# Patient Record
Sex: Female | Born: 1955 | Race: White | Hispanic: No | Marital: Married | Smoking: Never smoker
Health system: Southern US, Community
[De-identification: ages and names within clinical notes are randomized; demographics above are authoritative.]

## PROBLEM LIST (undated history)

## (undated) DIAGNOSIS — K219 Gastro-esophageal reflux disease without esophagitis: Secondary | ICD-10-CM

## (undated) DIAGNOSIS — I8390 Asymptomatic varicose veins of unspecified lower extremity: Secondary | ICD-10-CM

## (undated) DIAGNOSIS — G4730 Sleep apnea, unspecified: Secondary | ICD-10-CM

## (undated) DIAGNOSIS — L409 Psoriasis, unspecified: Secondary | ICD-10-CM

## (undated) DIAGNOSIS — M1990 Unspecified osteoarthritis, unspecified site: Secondary | ICD-10-CM

## (undated) DIAGNOSIS — E079 Disorder of thyroid, unspecified: Secondary | ICD-10-CM

## (undated) DIAGNOSIS — L719 Rosacea, unspecified: Secondary | ICD-10-CM

## (undated) DIAGNOSIS — E7800 Pure hypercholesterolemia, unspecified: Secondary | ICD-10-CM

## (undated) DIAGNOSIS — Z8739 Personal history of other diseases of the musculoskeletal system and connective tissue: Secondary | ICD-10-CM

## (undated) DIAGNOSIS — R339 Retention of urine, unspecified: Secondary | ICD-10-CM

## (undated) DIAGNOSIS — K146 Glossodynia: Secondary | ICD-10-CM

## (undated) DIAGNOSIS — M797 Fibromyalgia: Secondary | ICD-10-CM

## (undated) DIAGNOSIS — B029 Zoster without complications: Secondary | ICD-10-CM

## (undated) HISTORY — PX: TOTAL HIP ARTHROPLASTY: SHX124

## (undated) HISTORY — PX: ABDOMINAL HYSTERECTOMY: SHX81

## (undated) HISTORY — PX: TONSILLECTOMY: SUR1361

## (undated) HISTORY — PX: BLADDER SUSPENSION: SHX72

---

## 2001-06-30 ENCOUNTER — Encounter: Payer: Self-pay | Admitting: Gastroenterology

## 2001-06-30 ENCOUNTER — Encounter: Admission: RE | Admit: 2001-06-30 | Discharge: 2001-06-30 | Payer: Self-pay | Admitting: Gastroenterology

## 2001-09-02 ENCOUNTER — Ambulatory Visit (HOSPITAL_COMMUNITY): Admission: RE | Admit: 2001-09-02 | Discharge: 2001-09-02 | Payer: Self-pay | Admitting: Gastroenterology

## 2001-09-29 ENCOUNTER — Encounter: Payer: Self-pay | Admitting: Gastroenterology

## 2001-09-29 ENCOUNTER — Ambulatory Visit (HOSPITAL_COMMUNITY): Admission: RE | Admit: 2001-09-29 | Discharge: 2001-09-29 | Payer: Self-pay | Admitting: Gastroenterology

## 2001-12-12 ENCOUNTER — Ambulatory Visit (HOSPITAL_COMMUNITY): Admission: RE | Admit: 2001-12-12 | Discharge: 2001-12-12 | Payer: Self-pay | Admitting: Obstetrics & Gynecology

## 2001-12-12 ENCOUNTER — Encounter: Payer: Self-pay | Admitting: Obstetrics & Gynecology

## 2002-01-06 ENCOUNTER — Encounter: Payer: Self-pay | Admitting: Internal Medicine

## 2002-01-06 ENCOUNTER — Encounter: Admission: RE | Admit: 2002-01-06 | Discharge: 2002-01-06 | Payer: Self-pay | Admitting: Internal Medicine

## 2002-05-03 ENCOUNTER — Encounter: Admission: RE | Admit: 2002-05-03 | Discharge: 2002-05-03 | Payer: Self-pay | Admitting: Internal Medicine

## 2002-05-03 ENCOUNTER — Encounter: Payer: Self-pay | Admitting: Internal Medicine

## 2003-12-27 ENCOUNTER — Emergency Department: Payer: Self-pay | Admitting: General Practice

## 2004-01-04 ENCOUNTER — Emergency Department: Payer: Self-pay | Admitting: Emergency Medicine

## 2004-09-12 ENCOUNTER — Emergency Department: Payer: Self-pay | Admitting: Emergency Medicine

## 2005-10-07 ENCOUNTER — Encounter: Admission: RE | Admit: 2005-10-07 | Discharge: 2005-10-07 | Payer: Self-pay | Admitting: Gastroenterology

## 2006-10-29 ENCOUNTER — Emergency Department: Payer: Self-pay | Admitting: Emergency Medicine

## 2009-04-23 ENCOUNTER — Ambulatory Visit: Payer: Self-pay | Admitting: Gynecologic Oncology

## 2009-04-25 ENCOUNTER — Ambulatory Visit: Payer: Self-pay | Admitting: Internal Medicine

## 2009-05-09 ENCOUNTER — Encounter: Payer: Self-pay | Admitting: Internal Medicine

## 2009-05-09 ENCOUNTER — Ambulatory Visit: Payer: Self-pay | Admitting: Gynecologic Oncology

## 2009-05-24 ENCOUNTER — Encounter: Payer: Self-pay | Admitting: Internal Medicine

## 2009-05-24 ENCOUNTER — Ambulatory Visit: Payer: Self-pay | Admitting: Gynecologic Oncology

## 2009-07-26 ENCOUNTER — Ambulatory Visit: Payer: Self-pay | Admitting: Internal Medicine

## 2010-03-16 ENCOUNTER — Encounter: Payer: Self-pay | Admitting: Gastroenterology

## 2010-06-17 ENCOUNTER — Ambulatory Visit: Payer: Self-pay | Admitting: Podiatry

## 2010-07-10 ENCOUNTER — Ambulatory Visit: Payer: Self-pay | Admitting: Podiatry

## 2010-07-11 NOTE — Procedures (Signed)
[place]  Patient:    Linda Blanchard, Linda Blanchard Visit Number: [id number] MRN: [id number]          Service Type: END Location: ENDO Attending Physician:  Charna Elizabeth Dictated by:   Anselmo Rod, M.D. Proc. Date: 09/02/01 Admit Date:  09/02/2001 Discharge Date: 09/02/2001   CC:         Cala Bradford R. Renae Gloss, M.D.   Procedure Report  DATE OF BIRTH:  [birth year]  PROCEDURE PERFORMED:  Colonoscopy.  ENDOSCOPIST:  Anselmo Rod, M.D.  INSTRUMENT USED:  Olympus video colonoscope (adjustable pediatric scope).  INDICATIONS FOR PROCEDURE:  Abdominal pain and change in bowel habits, guaiac-positive stools in a 55 year old white female, rule out colonic polyps, masses, hemorrhoids, etc.  PREPROCEDURE PREPARATION:  Informed consent was procured from the patient. The patient was fasted for eight hours prior to the procedure.  She was prepped with a bottle of magnesium citrate and a gallon of NuLYTELY the night prior to the procedure.  PREPROCEDURE PHYSICAL EXAMINATION:  VITAL SIGNS:  Stable.  NECK:  Supple.  CHEST:  Clear to auscultation.  HEART:  S1 and S2 regular.  ABDOMEN:  Soft with normal bowel sounds.  DESCRIPTION OF PROCEDURE:  The patient was placed in the left lateral decubitus position and sedated with 100 mg of Demerol and 10 mg of Versed intravenously.  Once the patient was adequately sedated and maintained on low flow oxygen and continuous cardiac monitoring, the Olympus video colonoscope was advanced from the rectum to the cecum and terminal ileum without difficulty.  The entire colonic mucosa and the TI appeared normal, except for small non-bleeding internal hemorrhoids.  IMPRESSION:  Normal colonoscopy except for small non-bleeding internal hemorrhoids.  RECOMMENDATIONS: 1. Repeat guaiacs on an outpatient basis. 2. High fiber diet. 3. Further recommendations will be made as the patient is seen in the office. Dictated by:    Anselmo Rod, M.D. Attending Physician:  Charna Elizabeth DD:  09/02/01 TD:  09/05/01 Job: [id number]

## 2010-12-24 ENCOUNTER — Emergency Department: Payer: Self-pay | Admitting: Emergency Medicine

## 2011-12-15 ENCOUNTER — Ambulatory Visit: Payer: Self-pay

## 2012-06-07 ENCOUNTER — Telehealth: Payer: Self-pay | Admitting: Oncology

## 2012-06-07 NOTE — Telephone Encounter (Signed)
LVOM FOR PT TO RETURN CALL IN RE TO REFERRAL.  °

## 2015-04-11 ENCOUNTER — Other Ambulatory Visit: Payer: Self-pay | Admitting: Internal Medicine

## 2015-04-11 DIAGNOSIS — R0602 Shortness of breath: Secondary | ICD-10-CM

## 2015-04-12 ENCOUNTER — Ambulatory Visit
Admission: RE | Admit: 2015-04-12 | Discharge: 2015-04-12 | Disposition: A | Discharge status: Home / Self Care | Source: Ambulatory Visit | Attending: Internal Medicine | Admitting: Internal Medicine

## 2015-04-12 DIAGNOSIS — R0602 Shortness of breath: Secondary | ICD-10-CM

## 2015-05-04 ENCOUNTER — Encounter: Payer: Self-pay | Admitting: Emergency Medicine

## 2015-05-04 ENCOUNTER — Emergency Department
Admission: EM | Admit: 2015-05-04 | Discharge: 2015-05-04 | Disposition: A | Discharge status: Home / Self Care | Attending: Emergency Medicine | Admitting: Emergency Medicine

## 2015-05-04 ENCOUNTER — Emergency Department

## 2015-05-04 DIAGNOSIS — L03115 Cellulitis of right lower limb: Secondary | ICD-10-CM | POA: Insufficient documentation

## 2015-05-04 DIAGNOSIS — R600 Localized edema: Secondary | ICD-10-CM | POA: Insufficient documentation

## 2015-05-04 DIAGNOSIS — R2241 Localized swelling, mass and lump, right lower limb: Secondary | ICD-10-CM | POA: Diagnosis present

## 2015-05-04 HISTORY — DX: Glossodynia: K14.6

## 2015-05-04 HISTORY — DX: Psoriasis, unspecified: L40.9

## 2015-05-04 HISTORY — DX: Unspecified osteoarthritis, unspecified site: M19.90

## 2015-05-04 HISTORY — DX: Gastro-esophageal reflux disease without esophagitis: K21.9

## 2015-05-04 HISTORY — DX: Rosacea, unspecified: L71.9

## 2015-05-04 HISTORY — DX: Asymptomatic varicose veins of unspecified lower extremity: I83.90

## 2015-05-04 HISTORY — DX: Sleep apnea, unspecified: G47.30

## 2015-05-04 HISTORY — DX: Zoster without complications: B02.9

## 2015-05-04 HISTORY — DX: Pure hypercholesterolemia, unspecified: E78.00

## 2015-05-04 HISTORY — DX: Personal history of other diseases of the musculoskeletal system and connective tissue: Z87.39

## 2015-05-04 HISTORY — DX: Fibromyalgia: M79.7

## 2015-05-04 HISTORY — DX: Disorder of thyroid, unspecified: E07.9

## 2015-05-04 HISTORY — DX: Retention of urine, unspecified: R33.9

## 2015-05-04 LAB — COMPREHENSIVE METABOLIC PANEL
ALBUMIN: 3.8 g/dL (ref 3.5–5.0)
ALK PHOS: 83 U/L (ref 38–126)
ALT: 37 U/L (ref 14–54)
ANION GAP: 7 (ref 5–15)
AST: 31 U/L (ref 15–41)
BUN: 13 mg/dL (ref 6–20)
CALCIUM: 9.7 mg/dL (ref 8.9–10.3)
CHLORIDE: 104 mmol/L (ref 101–111)
CO2: 29 mmol/L (ref 22–32)
Creatinine, Ser: 0.74 mg/dL (ref 0.44–1.00)
GFR calc non Af Amer: >60 mL/min (ref >60)
GLUCOSE: 94 mg/dL (ref 65–99)
Potassium: 4.1 mmol/L (ref 3.5–5.1)
SODIUM: 140 mmol/L (ref 135–145)
Total Bilirubin: 0.6 mg/dL (ref 0.3–1.2)
Total Protein: 7.4 g/dL (ref 6.5–8.1)

## 2015-05-04 LAB — CBC WITH DIFFERENTIAL/PLATELET
BASOS PCT: 1 %
Basophils Absolute: 0.1 10*3/uL (ref 0–0.1)
Eosinophils Absolute: 0.2 10*3/uL (ref 0–0.7)
Eosinophils Relative: 3 %
HEMATOCRIT: 35.2 % (ref 35.0–47.0)
HEMOGLOBIN: 11.6 g/dL — AB (ref 12.0–16.0)
LYMPHS ABS: 2 10*3/uL (ref 1.0–3.6)
LYMPHS PCT: 20 %
MCH: 26.6 pg (ref 26.0–34.0)
MCHC: 33 g/dL (ref 32.0–36.0)
MCV: 80.6 fL (ref 80.0–100.0)
MONO ABS: 0.5 10*3/uL (ref 0.2–0.9)
MONOS PCT: 6 %
NEUTROS ABS: 6.9 10*3/uL — AB (ref 1.4–6.5)
NEUTROS PCT: 70 %
PLATELETS: 288 10*3/uL (ref 150–440)
RBC: 4.37 MIL/uL (ref 3.80–5.20)
RDW: 16 % — ABNORMAL HIGH (ref 11.5–14.5)
WBC: 9.8 10*3/uL (ref 3.6–11.0)

## 2015-05-04 LAB — CK: CK TOTAL: 64 U/L (ref 38–234)

## 2015-05-04 MED ORDER — SULFAMETHOXAZOLE-TRIMETHOPRIM 800-160 MG PO TABS
2.0000 | ORAL_TABLET | Freq: Once | ORAL | Status: AC
  Administered 2015-05-04: 2 via ORAL
  Filled 2015-05-04: qty 2

## 2015-05-04 MED ORDER — SULFAMETHOXAZOLE-TRIMETHOPRIM 800-160 MG PO TABS: 2.0000 | ORAL_TABLET | Freq: Two times a day (BID) | ORAL | Status: AC

## 2015-05-04 MED ORDER — CEPHALEXIN 500 MG PO CAPS
500.0000 mg | ORAL_CAPSULE | Freq: Once | ORAL | Status: AC
Start: 2015-05-04 — End: 2015-05-04
  Administered 2015-05-04: 500 mg via ORAL
  Filled 2015-05-04: qty 1

## 2015-05-04 MED ORDER — CEPHALEXIN 500 MG PO CAPS
500.0000 mg | ORAL_CAPSULE | Freq: Four times a day (QID) | ORAL | Status: AC
Start: 2015-05-04 — End: 2015-05-14

## 2015-05-04 NOTE — ED Provider Notes (Signed)
[facility] Emergency Department Provider Note  ____________________________________________  Time seen: Approximately 510 PM  I have reviewed the triage vital signs and the nursing notes.   HISTORY  Chief Complaint Leg Swelling    HPI Linda Blanchard is a 60 y.o. female with a history of hypercholesterolemia as well as psoriasis who is presenting to the emergency department today with right lower extremity pain and swelling. She says that the symptoms have been ongoing for the past 3 days.  She describes the pain as an aching pain. Says she also recently just started a cholesterol medication. Denies any redness. Says that her ankles have also been swelling over the past several weeks and she is being worked up for her as an outpatient. She is at an echocardiogram which said was abnormal and is therefore scheduling follow-up with cardiology. Denies any chest pain or shortness of breath today.   Past Medical History  Diagnosis Date  . Hypercholesterolemia   . Arthritis     spondylosis  . History of neck pain     bulging disc  . Rosacea   . Bladder retention   . Varicose veins     bilateral  . Fibromyalgia   . Acid reflux   . Burning mouth syndrome   . Shingles   . Thyroid disease   . Sleep apnea   . Psoriasis     There are no active problems to display for this patient.   Past Surgical History  Procedure Laterality Date  . Abdominal hysterectomy    . Cesarean section    . Total hip arthroplasty Left   . Tonsillectomy    . Bladder suspension      No current outpatient prescriptions on file.  Allergies Benicar; Dexilant; Enablex; Etodolac; Fexofenadine; Hydrocodone; Lipitor; Lyrica; Meloxicam; Metoclopramide; Percocet; Percodan; Topamax; Ultracet; Ultram; Vesicare; Zocor; and Zolpidem  History reviewed. No pertinent family history.  Social History Social History  Substance Use Topics  . Smoking status: Never Smoker   . Smokeless  tobacco: None  . Alcohol Use: No    Review of Systems Constitutional: No fever/chills Eyes: No visual changes. ENT: No sore throat. Cardiovascular: Denies chest pain. Respiratory: Denies shortness of breath. Gastrointestinal: No abdominal pain.  No nausea, no vomiting.  No diarrhea.  No constipation. Genitourinary: Negative for dysuria. Musculoskeletal: Negative for back pain. Skin: Negative for rash. Neurological: Negative for headaches, focal weakness or numbness.  10-point ROS otherwise negative.  ____________________________________________   PHYSICAL EXAM:  VITAL SIGNS: ED Triage Vitals  Enc Vitals Group     BP 05/04/15 1335 139/86 mmHg     Pulse Rate 05/04/15 1335 85     Resp 05/04/15 1335 20     Temp 05/04/15 1335 97.5 F (36.4 C)     Temp Source 05/04/15 1335 Oral     SpO2 05/04/15 1335 100 %     Weight 05/04/15 1335 230 lb (104.327 kg)     Height 05/04/15 1335 5\' 5"  (1.651 m)     Head Cir --      Peak Flow --      Pain Score 05/04/15 1336 6     Pain Loc --      Pain Edu? --      Excl. in GC? --     Constitutional: Alert and oriented. Well appearing and in no acute distress. Eyes: Conjunctivae are normal. PERRL. EOMI. Head: Atraumatic. Nose: No congestion/rhinnorhea. Mouth/Throat: Mucous membranes are moist.   Neck: No stridor.  Cardiovascular: Normal rate, regular rhythm. Grossly normal heart sounds.  Good peripheral circulation. Respiratory: Normal respiratory effort.  No retractions. Lungs CTAB. Gastrointestinal: Soft and nontender. No distention. No abdominal bruits. No CVA tenderness. Musculoskeletal: Mild to moderate bilateral lower extremity edema which extends just above the bilateral ankles.  No joint effusions. Neurologic:  Normal speech and language. No gross focal neurologic deficits are appreciated. No gait instability. Skin:  Erythematous, raised lesions to bilateral knees as well as shins and lateral calves. Patient says that they are  itchy. Mild tenderness to the right calf laterally as well as posteriorly extending just above the right knee. No induration. Psychiatric: Mood and affect are normal. Speech and behavior are normal.  ____________________________________________   LABS (all labs ordered are listed, but only abnormal results are displayed)  Labs Reviewed  CBC WITH DIFFERENTIAL/PLATELET - Abnormal; Notable for the following:    Hemoglobin 11.6 (*)    RDW 16.0 (*)    Neutro Abs 6.9 (*)    All other components within normal limits  CK  COMPREHENSIVE METABOLIC PANEL   ____________________________________________  EKG   ____________________________________________  RADIOLOGY  IMPRESSION: No deep venous thrombosis in the visualized right lower extremity.   Electronically Signed By: Charline Bills M.D. On: 05/04/2015 14:34 ____________________________________________   PROCEDURES    ____________________________________________   INITIAL IMPRESSION / ASSESSMENT AND PLAN / ED COURSE  Pertinent labs & imaging results that were available during my care of the patient were reviewed by me and considered in my medical decision making (see chart for details).  ----------------------------------------- 7:30 PM on 05/04/2015 -----------------------------------------  Patient with very reassuring workup including negative DVT study. We'll treat for cellulitis. Does not have overt erythema but the patient is on a steroid cream for psoriasis and I feel that her presentation may be typical because of this. We'll do with antibiotics. We'll discharge to home. ____________________________________________   FINAL CLINICAL IMPRESSION(S) / ED DIAGNOSES  Cellulitis.    Myrna Blazer, MD 05/04/15 [phone]

## 2015-05-04 NOTE — ED Notes (Signed)
Increased swelling R leg x 3 days, no injury. Has had pedal edema but not increased R leg.

## 2015-05-04 NOTE — Discharge Instructions (Signed)

## 2015-06-06 ENCOUNTER — Encounter: Payer: Self-pay | Admitting: Emergency Medicine

## 2015-06-06 ENCOUNTER — Emergency Department

## 2015-06-06 ENCOUNTER — Emergency Department
Admission: EM | Admit: 2015-06-06 | Discharge: 2015-06-06 | Disposition: A | Discharge status: Home / Self Care | Attending: Emergency Medicine | Admitting: Emergency Medicine

## 2015-06-06 DIAGNOSIS — E7800 Pure hypercholesterolemia, unspecified: Secondary | ICD-10-CM | POA: Insufficient documentation

## 2015-06-06 DIAGNOSIS — S060X1A Concussion with loss of consciousness of 30 minutes or less, initial encounter: Secondary | ICD-10-CM

## 2015-06-06 DIAGNOSIS — S233XXA Sprain of ligaments of thoracic spine, initial encounter: Secondary | ICD-10-CM | POA: Diagnosis not present

## 2015-06-06 DIAGNOSIS — E079 Disorder of thyroid, unspecified: Secondary | ICD-10-CM | POA: Diagnosis not present

## 2015-06-06 DIAGNOSIS — R0789 Other chest pain: Secondary | ICD-10-CM | POA: Diagnosis not present

## 2015-06-06 DIAGNOSIS — Y92410 Unspecified street and highway as the place of occurrence of the external cause: Secondary | ICD-10-CM | POA: Insufficient documentation

## 2015-06-06 DIAGNOSIS — S86811A Strain of other muscle(s) and tendon(s) at lower leg level, right leg, initial encounter: Secondary | ICD-10-CM | POA: Insufficient documentation

## 2015-06-06 DIAGNOSIS — Y999 Unspecified external cause status: Secondary | ICD-10-CM | POA: Insufficient documentation

## 2015-06-06 DIAGNOSIS — S0033XA Contusion of nose, initial encounter: Secondary | ICD-10-CM

## 2015-06-06 DIAGNOSIS — M1990 Unspecified osteoarthritis, unspecified site: Secondary | ICD-10-CM | POA: Diagnosis not present

## 2015-06-06 DIAGNOSIS — I8393 Asymptomatic varicose veins of bilateral lower extremities: Secondary | ICD-10-CM | POA: Insufficient documentation

## 2015-06-06 DIAGNOSIS — R55 Syncope and collapse: Secondary | ICD-10-CM

## 2015-06-06 DIAGNOSIS — S86911A Strain of unspecified muscle(s) and tendon(s) at lower leg level, right leg, initial encounter: Secondary | ICD-10-CM

## 2015-06-06 DIAGNOSIS — Y939 Activity, unspecified: Secondary | ICD-10-CM | POA: Diagnosis not present

## 2015-06-06 DIAGNOSIS — S239XXA Sprain of unspecified parts of thorax, initial encounter: Secondary | ICD-10-CM

## 2015-06-06 LAB — BASIC METABOLIC PANEL
ANION GAP: 8 (ref 5–15)
BUN: 22 mg/dL — ABNORMAL HIGH (ref 6–20)
CO2: 21 mmol/L — AB (ref 22–32)
Calcium: 9.3 mg/dL (ref 8.9–10.3)
Chloride: 107 mmol/L (ref 101–111)
Creatinine, Ser: 0.81 mg/dL (ref 0.44–1.00)
GFR calc non Af Amer: >60 mL/min (ref >60)
GLUCOSE: 166 mg/dL — AB (ref 65–99)
Potassium: 3.3 mmol/L — ABNORMAL LOW (ref 3.5–5.1)
Sodium: 136 mmol/L (ref 135–145)

## 2015-06-06 LAB — URINALYSIS COMPLETE WITH MICROSCOPIC (ARMC ONLY)
Bilirubin Urine: NEGATIVE
Glucose, UA: NEGATIVE mg/dL
Hgb urine dipstick: NEGATIVE
KETONES UR: NEGATIVE mg/dL
LEUKOCYTES UA: NEGATIVE
Nitrite: NEGATIVE
PH: 6 (ref 5.0–8.0)
PROTEIN: 100 mg/dL — AB
SPECIFIC GRAVITY, URINE: 1.013 (ref 1.005–1.030)

## 2015-06-06 LAB — CBC WITH DIFFERENTIAL/PLATELET
Basophils Absolute: 0.1 10*3/uL (ref 0–0.1)
Basophils Relative: 0 %
Eosinophils Absolute: 0 10*3/uL (ref 0–0.7)
Eosinophils Relative: 0 %
HEMATOCRIT: 37.4 % (ref 35.0–47.0)
Hemoglobin: 12.4 g/dL (ref 12.0–16.0)
LYMPHS ABS: 1.8 10*3/uL (ref 1.0–3.6)
LYMPHS PCT: 9 %
MCH: 26.9 pg (ref 26.0–34.0)
MCHC: 33.2 g/dL (ref 32.0–36.0)
MCV: 81 fL (ref 80.0–100.0)
MONO ABS: 1 10*3/uL — AB (ref 0.2–0.9)
MONOS PCT: 5 %
NEUTROS ABS: 17.5 10*3/uL — AB (ref 1.4–6.5)
Neutrophils Relative %: 86 %
Platelets: 411 10*3/uL (ref 150–440)
RBC: 4.62 MIL/uL (ref 3.80–5.20)
RDW: 16.6 % — AB (ref 11.5–14.5)
WBC: 20.3 10*3/uL — ABNORMAL HIGH (ref 3.6–11.0)

## 2015-06-06 MED ORDER — IBUPROFEN 800 MG PO TABS
800.0000 mg | ORAL_TABLET | Freq: Once | ORAL | Status: AC
  Administered 2015-06-06: 800 mg via ORAL
  Filled 2015-06-06: qty 1
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
## 2015-10-11 ENCOUNTER — Emergency Department

## 2015-10-11 ENCOUNTER — Emergency Department
Admission: EM | Admit: 2015-10-11 | Discharge: 2015-10-11 | Disposition: A | Discharge status: Home / Self Care | Attending: Emergency Medicine | Admitting: Emergency Medicine

## 2015-10-11 DIAGNOSIS — R51 Headache: Secondary | ICD-10-CM | POA: Insufficient documentation

## 2015-10-11 DIAGNOSIS — M5412 Radiculopathy, cervical region: Secondary | ICD-10-CM | POA: Diagnosis not present

## 2015-10-11 DIAGNOSIS — M542 Cervicalgia: Secondary | ICD-10-CM | POA: Diagnosis present

## 2015-10-11 MED ORDER — CYCLOBENZAPRINE HCL 10 MG PO TABS: 10.0000 mg | ORAL_TABLET | Freq: Three times a day (TID) | ORAL | 0 refills | Status: AC | PRN
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
## 2015-10-11 NOTE — ED Notes (Signed)
Pt reports intermittent "tingling in my neck" and running down bilateral arms.

## 2015-10-11 NOTE — ED Notes (Signed)
Upon this RN entry to room patient sleeping soundly in bed, respirations even and unlabored. No distress noted.

## 2015-10-11 NOTE — ED Notes (Signed)

## 2015-10-11 NOTE — ED Notes (Signed)
MD at bedside. 
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
## 2016-08-11 ENCOUNTER — Ambulatory Visit
Admission: RE | Admit: 2016-08-11 | Discharge: 2016-08-11 | Disposition: A | Discharge status: Home / Self Care | Source: Ambulatory Visit | Attending: Internal Medicine | Admitting: Internal Medicine

## 2016-08-11 ENCOUNTER — Other Ambulatory Visit: Payer: Self-pay | Admitting: Internal Medicine

## 2016-08-11 DIAGNOSIS — R0989 Other specified symptoms and signs involving the circulatory and respiratory systems: Secondary | ICD-10-CM

## 2016-08-11 DIAGNOSIS — R1012 Left upper quadrant pain: Secondary | ICD-10-CM

## 2016-08-12 ENCOUNTER — Other Ambulatory Visit: Payer: Self-pay | Admitting: Internal Medicine

## 2016-08-12 DIAGNOSIS — R0989 Other specified symptoms and signs involving the circulatory and respiratory systems: Secondary | ICD-10-CM

## 2016-08-12 DIAGNOSIS — R1012 Left upper quadrant pain: Secondary | ICD-10-CM

## 2017-07-05 ENCOUNTER — Other Ambulatory Visit: Payer: Self-pay | Admitting: Family Medicine

## 2017-07-05 DIAGNOSIS — R2241 Localized swelling, mass and lump, right lower limb: Secondary | ICD-10-CM

## 2017-07-08 ENCOUNTER — Other Ambulatory Visit
Admission: RE | Admit: 2017-07-08 | Discharge: 2017-07-08 | Disposition: A | Discharge status: Home / Self Care | Source: Ambulatory Visit | Attending: Family Medicine | Admitting: Family Medicine

## 2017-07-08 ENCOUNTER — Ambulatory Visit
Admission: RE | Admit: 2017-07-08 | Discharge: 2017-07-08 | Disposition: A | Discharge status: Home / Self Care | Source: Ambulatory Visit | Attending: Family Medicine | Admitting: Family Medicine

## 2017-07-08 ENCOUNTER — Encounter (INDEPENDENT_AMBULATORY_CARE_PROVIDER_SITE_OTHER): Payer: Self-pay

## 2017-07-08 ENCOUNTER — Ambulatory Visit: Admission: RE | Admit: 2017-07-08 | Source: Ambulatory Visit

## 2017-07-08 DIAGNOSIS — R2241 Localized swelling, mass and lump, right lower limb: Secondary | ICD-10-CM | POA: Insufficient documentation

## 2017-07-08 LAB — BASIC METABOLIC PANEL
Anion gap: 10 (ref 5–15)
BUN: 16 mg/dL (ref 6–20)
CHLORIDE: 102 mmol/L (ref 101–111)
CO2: 25 mmol/L (ref 22–32)
CREATININE: 0.7 mg/dL (ref 0.44–1.00)
Calcium: 9 mg/dL (ref 8.9–10.3)
GFR calc Af Amer: >60 mL/min (ref >60)
Glucose, Bld: 146 mg/dL — ABNORMAL HIGH (ref 65–99)
POTASSIUM: 3.9 mmol/L (ref 3.5–5.1)
SODIUM: 137 mmol/L (ref 135–145)

## 2017-07-08 MED ORDER — GADOBENATE DIMEGLUMINE 529 MG/ML IV SOLN
20.0000 mL | Freq: Once | INTRAVENOUS | Status: AC | PRN
  Administered 2017-07-08: 20 mL via INTRAVENOUS

## 2017-12-24 IMAGING — US US EXTREM LOW VENOUS*R*
1 series · 13 of 24 positions shown · non-contrast
Comparison: None.

CLINICAL DATA: Edema x2 days



[Series 1: us extrem low venous*right* · 0.08mm/px · 13 of 32 slices shown]
[im 1/32]
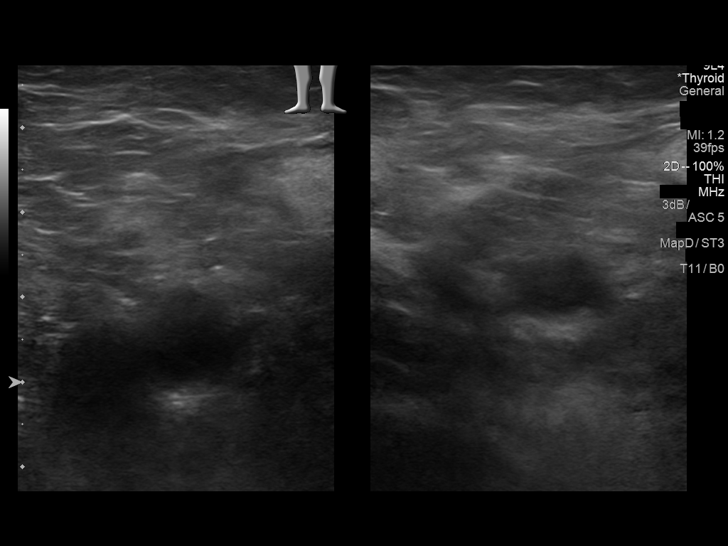
[im 3/32]
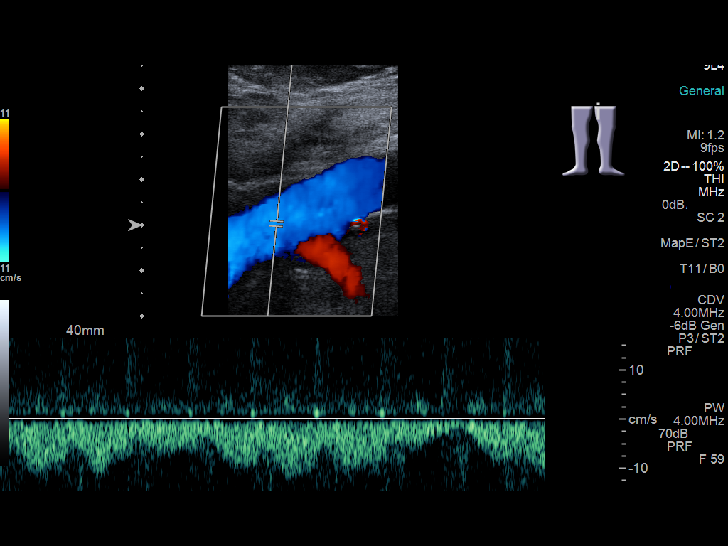
[im 6/32]
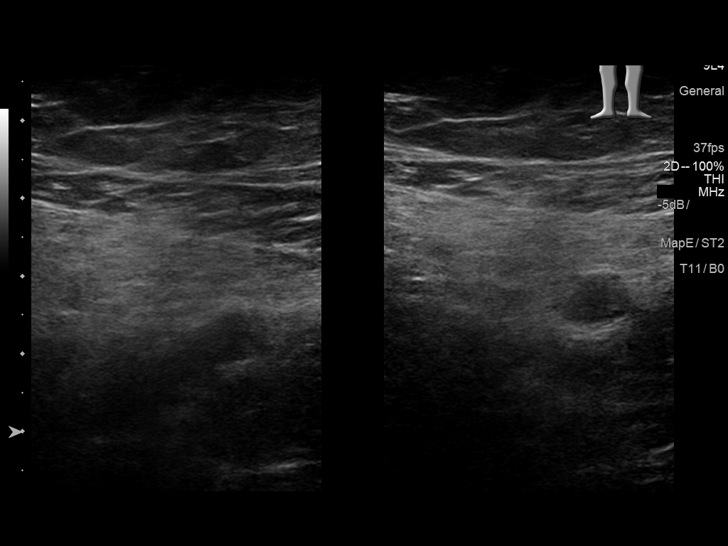
[im 9/32]
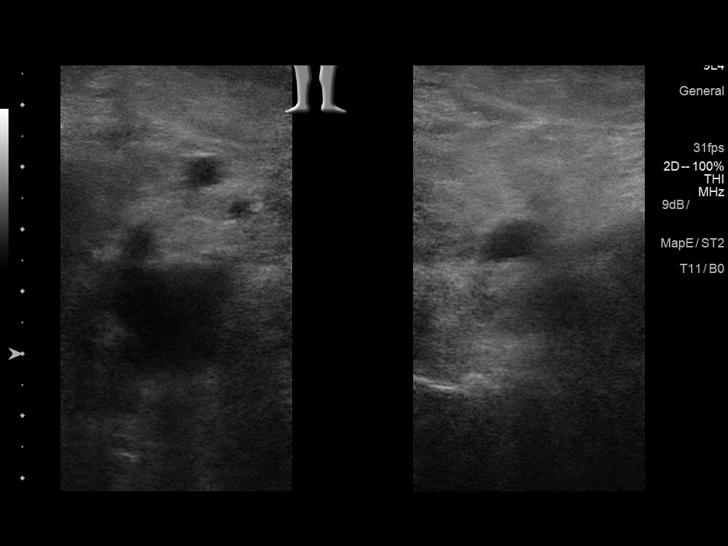
[im 11/32]
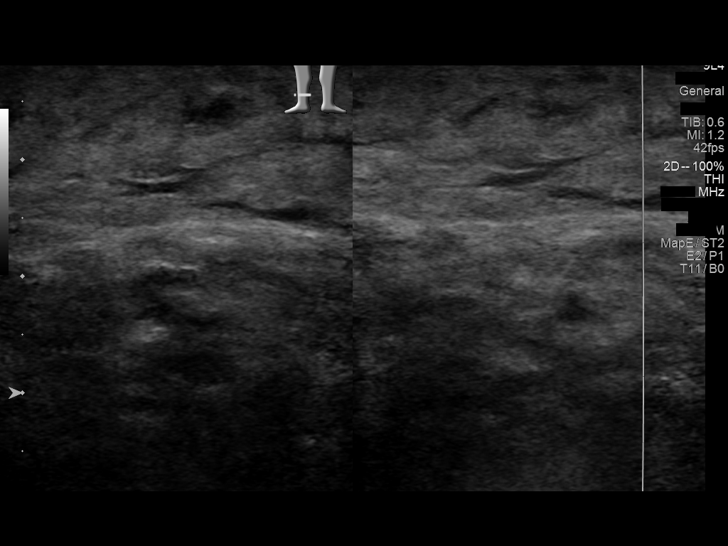
[im 14/32]
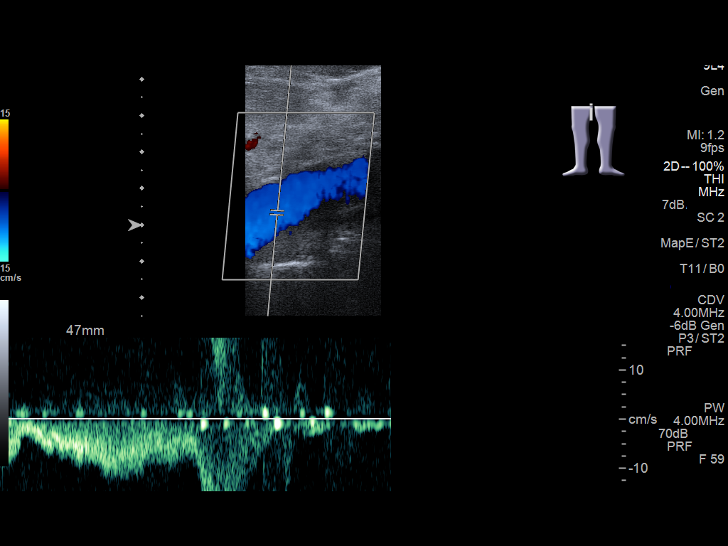
[im 17/32]
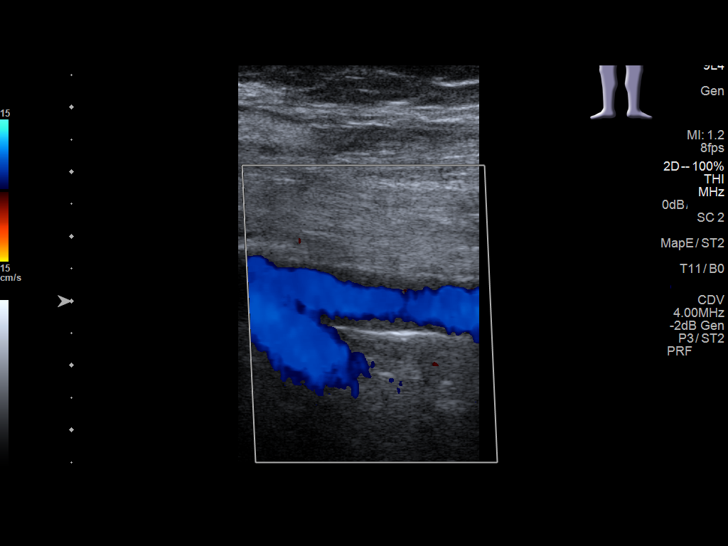
[im 18/32]
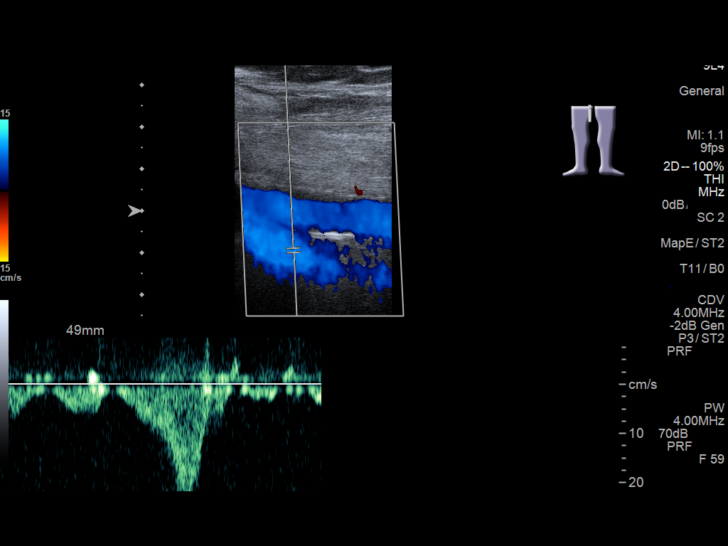
[im 21/32]
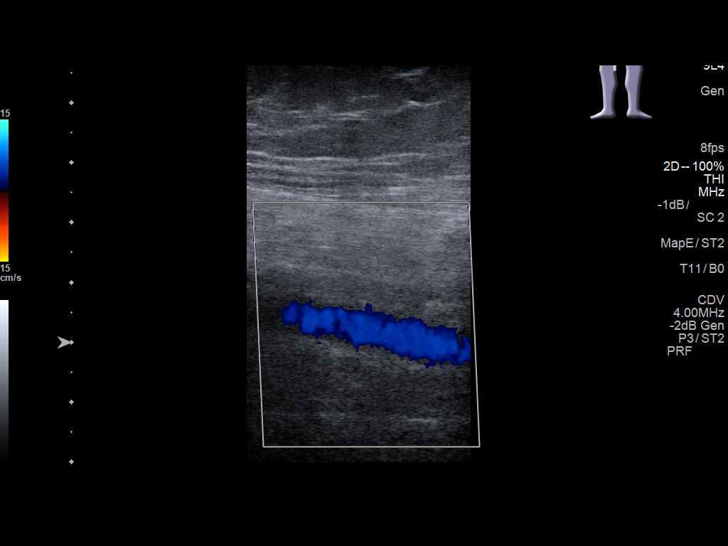
[im 23/32]
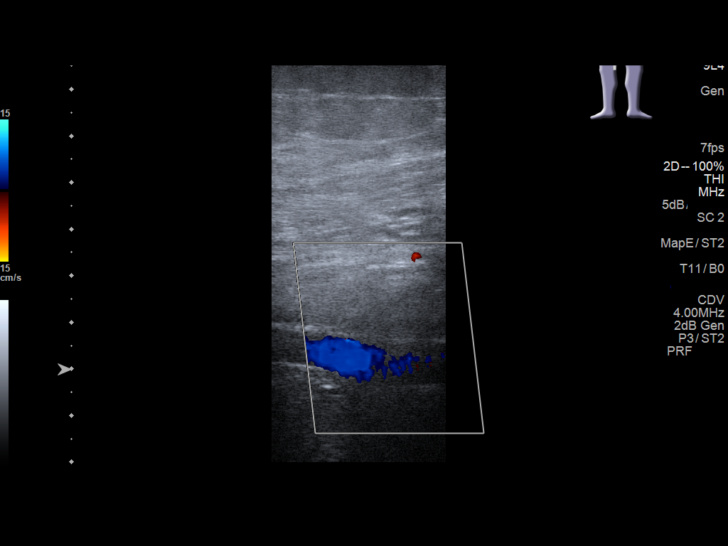
[im 26/32]
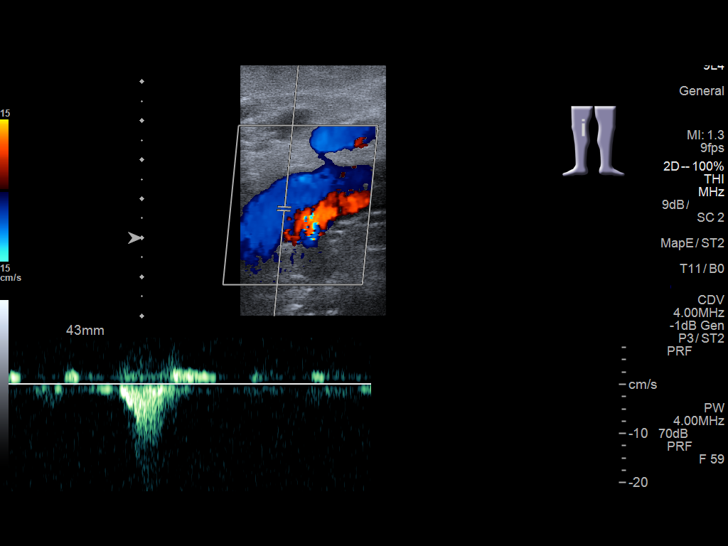
[im 29/32]
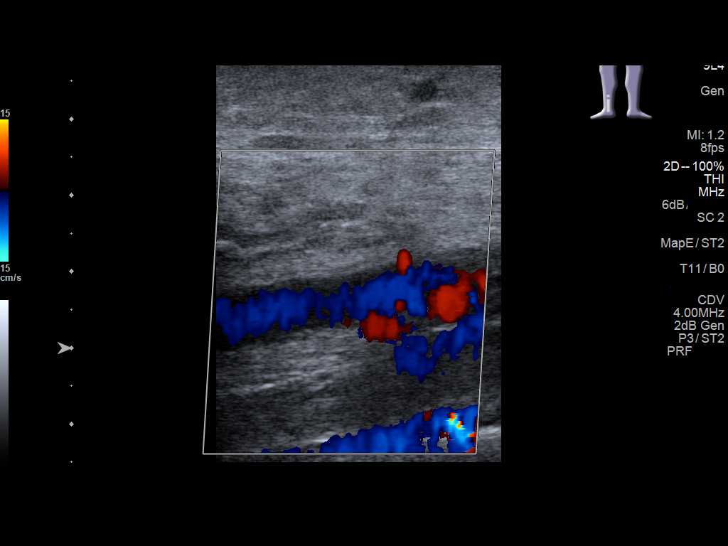
[im 32/32]
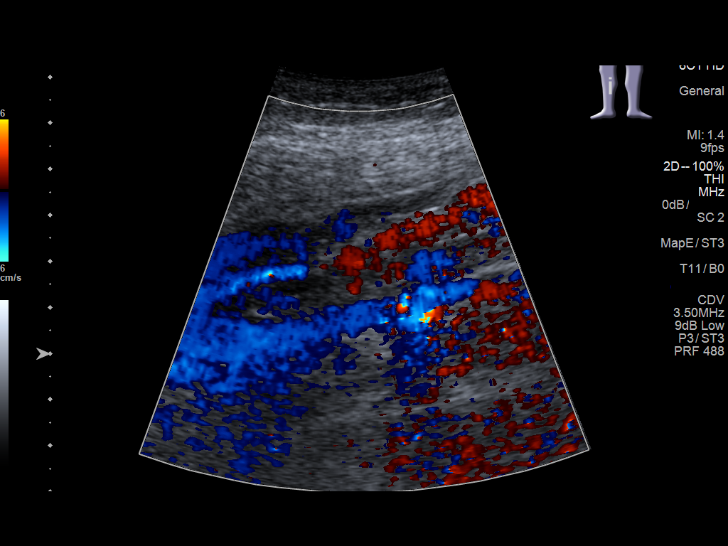

[13 of 24 positions shown; findings below may reference images not displayed]

FINDINGS: Contralateral Common Femoral Vein: Respiratory phasicity is normal
and symmetric with the symptomatic side. No evidence of thrombus.
Normal compressibility.

Common Femoral Vein: No evidence of thrombus. Normal
compressibility, respiratory phasicity and response to augmentation.

Saphenofemoral Junction: No evidence of thrombus. Normal
compressibility and flow on color Doppler imaging.

Profunda Femoral Vein: No evidence of thrombus. Normal
compressibility and flow on color Doppler imaging.

Femoral Vein: No evidence of thrombus. Normal compressibility,
respiratory phasicity and response to augmentation.

Popliteal Vein: No evidence of thrombus. Normal compressibility,
respiratory phasicity and response to augmentation.

Calf Veins: No evidence of thrombus. Normal compressibility and flow
on color Doppler imaging.

Superficial Great Saphenous Vein: No evidence of thrombus. Normal
compressibility and flow on color Doppler imaging.

Venous Reflux:  None.

Other Findings:  None.
IMPRESSION: No deep venous thrombosis in the visualized right lower extremity.

## 2019-12-22 ENCOUNTER — Other Ambulatory Visit: Payer: Self-pay | Admitting: Gastroenterology

## 2019-12-22 DIAGNOSIS — R1013 Epigastric pain: Secondary | ICD-10-CM

## 2020-01-12 ENCOUNTER — Ambulatory Visit
Admission: RE | Admit: 2020-01-12 | Discharge: 2020-01-12 | Disposition: A | Discharge status: Home / Self Care | Source: Ambulatory Visit | Attending: Gastroenterology | Admitting: Gastroenterology

## 2020-01-12 DIAGNOSIS — R1013 Epigastric pain: Secondary | ICD-10-CM

## 2020-01-12 MED ORDER — IOPAMIDOL (ISOVUE-300) INJECTION 61%
100.0000 mL | Freq: Once | INTRAVENOUS | Status: AC | PRN
  Administered 2020-01-12: 100 mL via INTRAVENOUS

## 2020-01-15 ENCOUNTER — Other Ambulatory Visit

## 2020-06-22 ENCOUNTER — Other Ambulatory Visit: Payer: Self-pay

## 2020-06-22 ENCOUNTER — Emergency Department
Admission: EM | Admit: 2020-06-22 | Discharge: 2020-06-23 | Disposition: A | Discharge status: Home / Self Care | Attending: Emergency Medicine | Admitting: Emergency Medicine

## 2020-06-22 ENCOUNTER — Encounter: Payer: Self-pay | Admitting: Emergency Medicine

## 2020-06-22 DIAGNOSIS — X100XXA Contact with hot drinks, initial encounter: Secondary | ICD-10-CM | POA: Insufficient documentation

## 2020-06-22 DIAGNOSIS — T310 Burns involving less than 10% of body surface: Secondary | ICD-10-CM | POA: Diagnosis not present

## 2020-06-22 DIAGNOSIS — Z96642 Presence of left artificial hip joint: Secondary | ICD-10-CM | POA: Diagnosis not present

## 2020-06-22 DIAGNOSIS — T24211A Burn of second degree of right thigh, initial encounter: Secondary | ICD-10-CM | POA: Insufficient documentation

## 2020-06-22 DIAGNOSIS — T2102XA Burn of unspecified degree of abdominal wall, initial encounter: Secondary | ICD-10-CM | POA: Diagnosis present

## 2020-06-22 DIAGNOSIS — E079 Disorder of thyroid, unspecified: Secondary | ICD-10-CM | POA: Insufficient documentation

## 2020-06-22 DIAGNOSIS — T2122XA Burn of second degree of abdominal wall, initial encounter: Secondary | ICD-10-CM | POA: Diagnosis not present

## 2020-06-22 DIAGNOSIS — T24111A Burn of first degree of right thigh, initial encounter: Secondary | ICD-10-CM

## 2020-06-22 DIAGNOSIS — X12XXXA Contact with other hot fluids, initial encounter: Secondary | ICD-10-CM | POA: Diagnosis not present

## 2020-06-22 DIAGNOSIS — T2112XA Burn of first degree of abdominal wall, initial encounter: Secondary | ICD-10-CM
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
## 2020-06-23 MED ORDER — ACETAMINOPHEN 500 MG PO TABS
1000.0000 mg | ORAL_TABLET | Freq: Once | ORAL | Status: AC
  Administered 2020-06-23: 1000 mg via ORAL
  Filled 2020-06-23: qty 2

## 2020-06-23 MED ORDER — SILVER SULFADIAZINE 1 % EX CREA: TOPICAL_CREAM | CUTANEOUS | 1 refills | Status: AC

## 2020-06-23 MED ORDER — SILVER SULFADIAZINE 1 % EX CREA
TOPICAL_CREAM | Freq: Once | CUTANEOUS | Status: AC
  Filled 2020-06-23: qty 85
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
## 2022-06-24 ENCOUNTER — Other Ambulatory Visit: Payer: Self-pay | Admitting: Family Medicine

## 2022-06-24 ENCOUNTER — Ambulatory Visit
Admission: RE | Admit: 2022-06-24 | Discharge: 2022-06-24 | Disposition: A | Discharge status: Home / Self Care | Payer: Medicare PPO | Source: Ambulatory Visit | Attending: Family Medicine | Admitting: Family Medicine

## 2022-06-24 DIAGNOSIS — J069 Acute upper respiratory infection, unspecified: Secondary | ICD-10-CM

## 2022-07-26 IMAGING — CT CT ABD-PELV W/ CM
2 of 5 series · 14 of 46 positions shown, 16 images · IV contrast (iopamidol)
Comparison: None.

CLINICAL DATA: Chronic abdominal pain.

EXAM:
CT ABDOMEN AND PELVIS WITH CONTRAST
TECHNIQUE: Multidetector CT imaging of the abdomen and pelvis was performed
using the standard protocol following bolus administration of
intravenous contrast.
CONTRAST:  100mL XDCYRL-GXX IOPAMIDOL (XDCYRL-GXX) INJECTION 61%

[Series 2: abd pelvis 5.00 br40 s3 axial · axial · 0.82mm/px · z∈[+1252,+1667]mm · 11 of 95 slices shown, 13 images]
[im 6/95  soft-tissue]
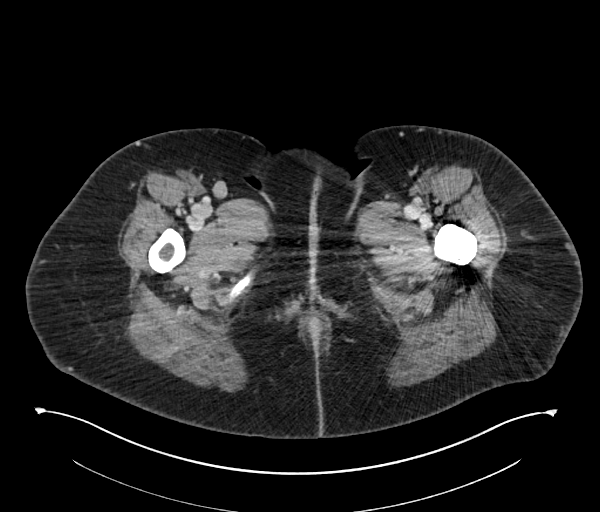
[im 6/95  bone]
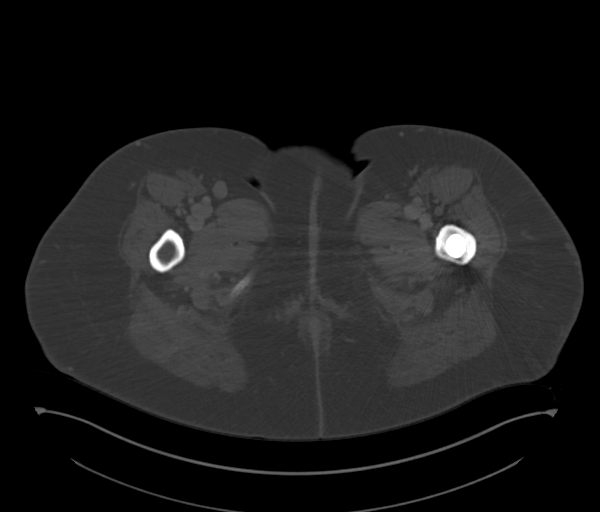
[im 16/95  soft-tissue]
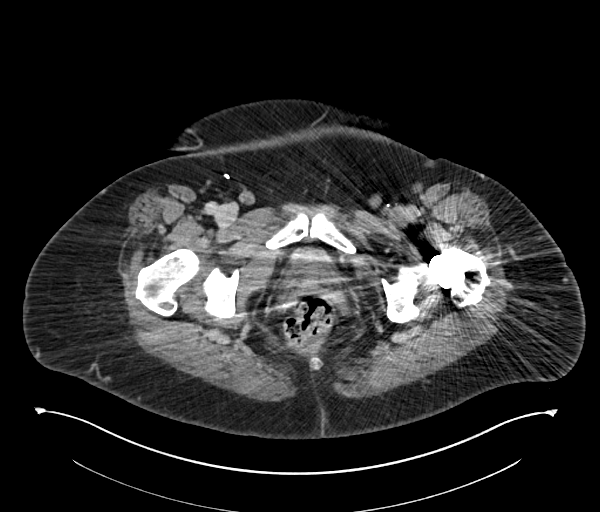
[im 21/95  soft-tissue]
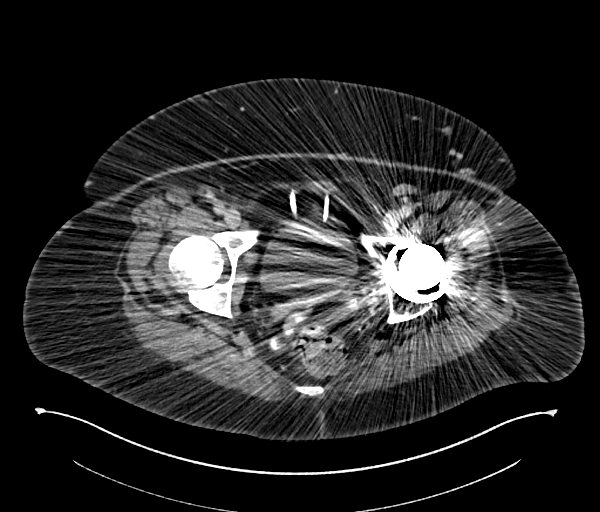
[im 32/95  soft-tissue]
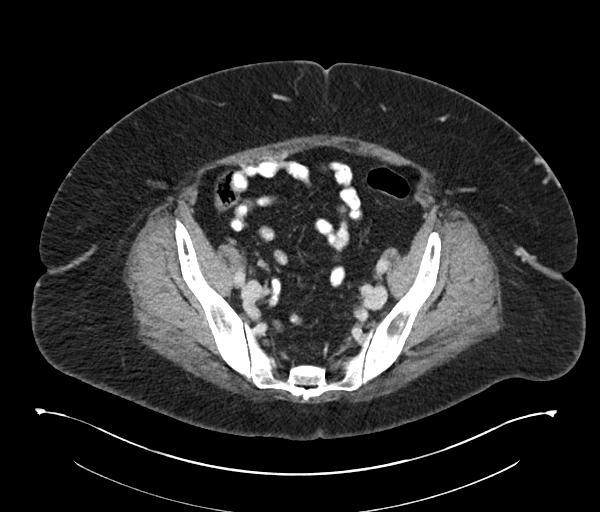
[im 37/95  soft-tissue]
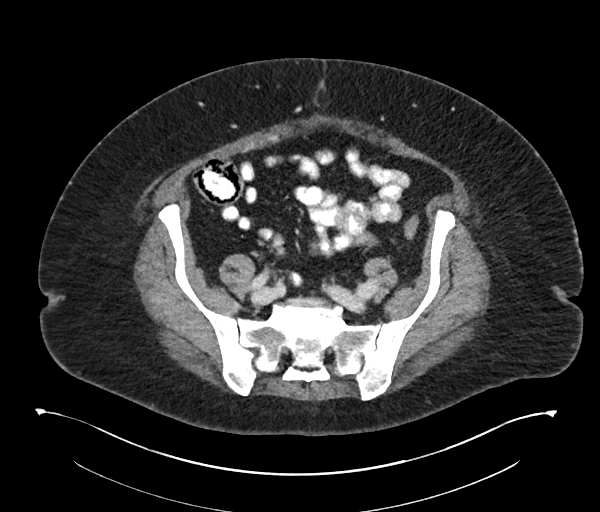
[im 48/95  soft-tissue]
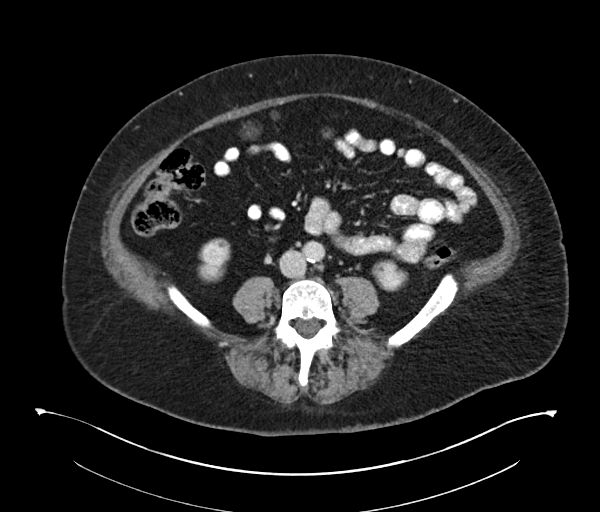
[im 58/95  soft-tissue]
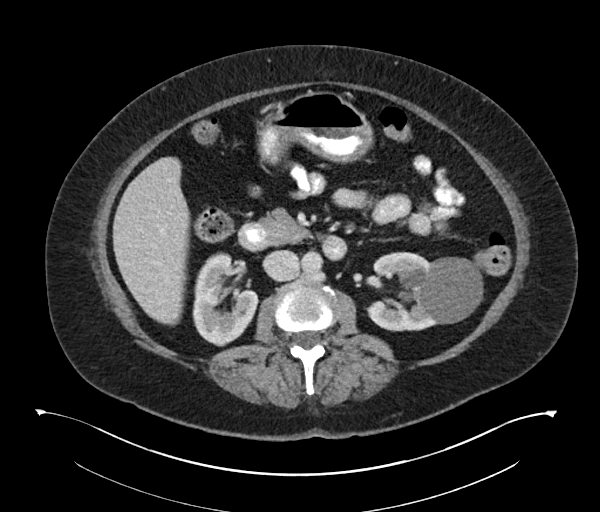
[im 63/95  soft-tissue]
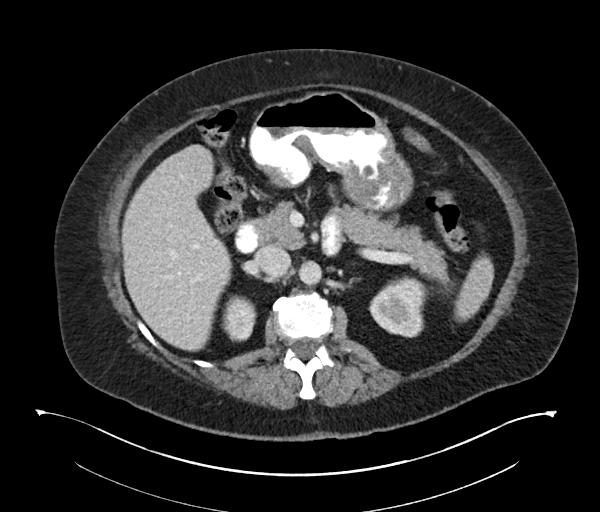
[im 74/95  soft-tissue]
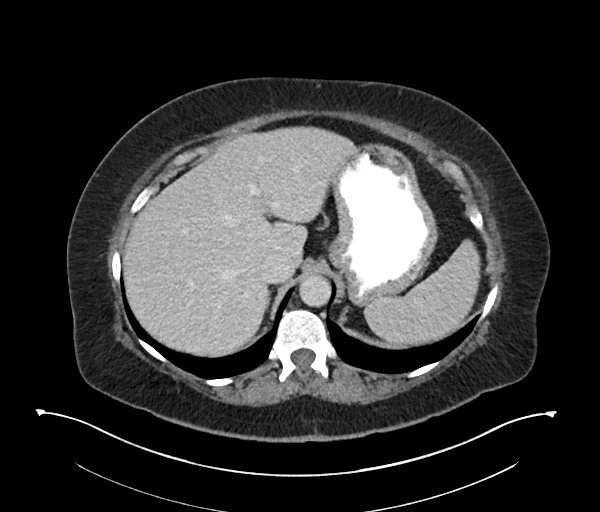
[im 74/95  bone]
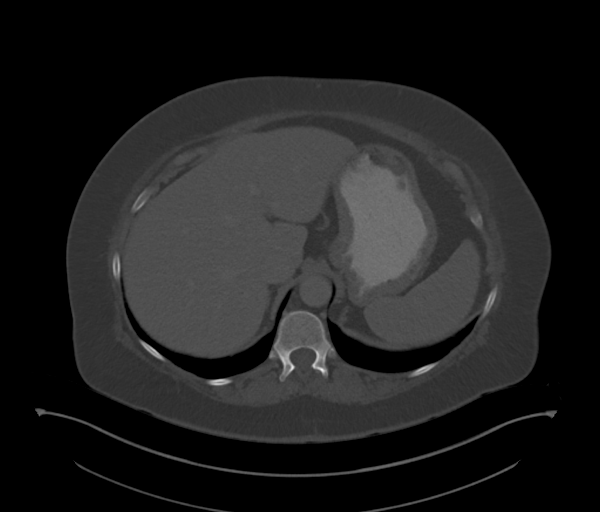
[im 79/95  soft-tissue]
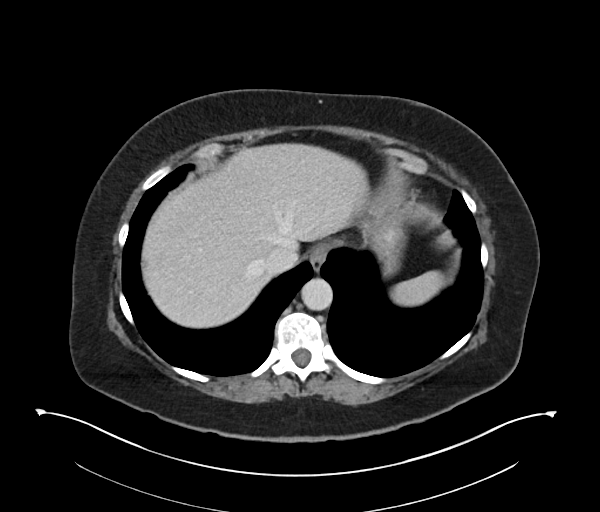
[im 89/95  soft-tissue]
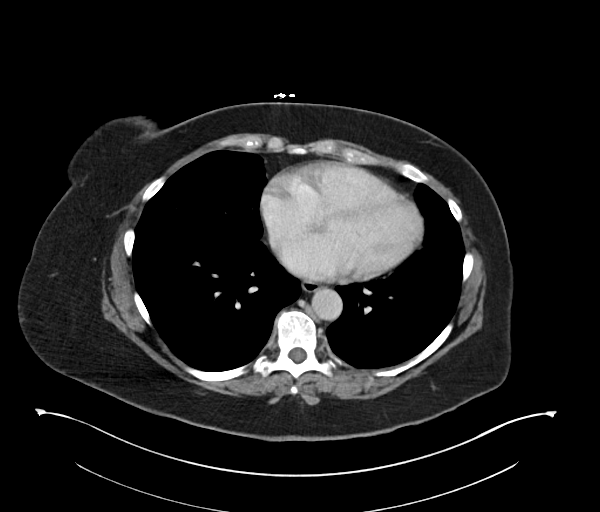

[Series 6: abd pelvis 2.00 br40 s3 cor · coronal · 0.93mm/px · 3 of 166 slices shown]
[im 56/166  soft-tissue]
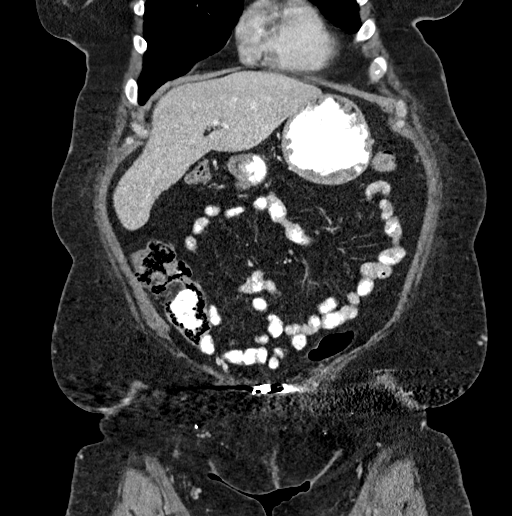
[im 74/166  soft-tissue]
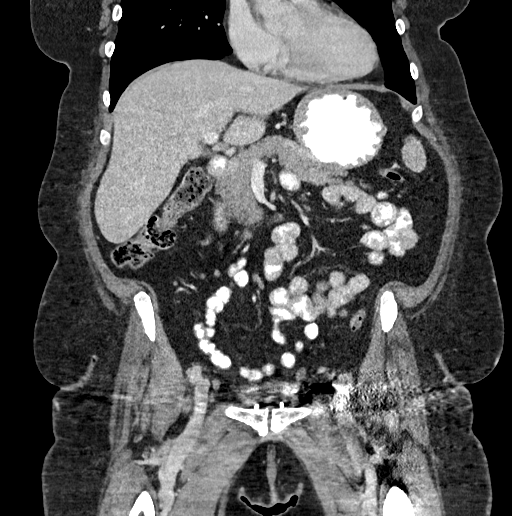
[im 92/166  soft-tissue]
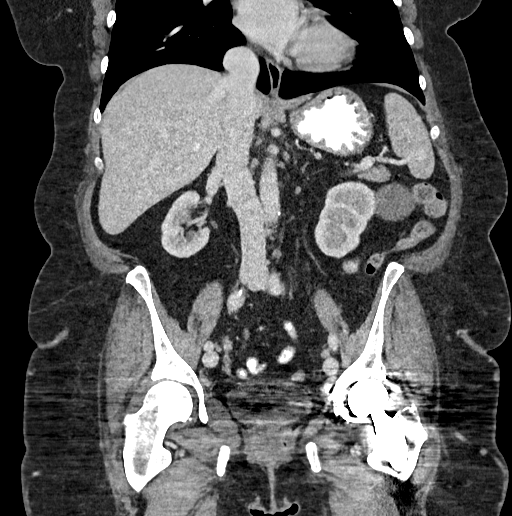

[14 of 46 positions shown; findings below may reference images not displayed]

FINDINGS: Lower chest: Insert lung bases

Hepatobiliary: No hepatic lesions or intrahepatic biliary
dilatation. The gallbladder is surgically absent. No common bile
duct dilatation.

Pancreas: No mass, inflammation or ductal dilatation.

Spleen: Normal size.  No focal lesions.

Adrenals/Urinary Tract: There is a 5 cm exophytic lesion projecting
off the upper pole midpole junction region of the left kidney. This
measures 31 Hounsfield units and I think it is most likely a
hyperdense or proteinaceous cyst. This was present on a prior
ultrasound from 3744 and measured a maximum of 3.8 cm. There are
also left-sided parapelvic renal cysts. I would recommend a
follow-up renal ultrasound examination to confirm this.

The adrenal glands and right kidney are normal. No collecting system
abnormalities are identified. The bladder is grossly normal. Limited
evaluation due to artifact left hip prosthesis.

Stomach/Bowel: The stomach, duodenum, small bowel and colon are
unremarkable. No acute inflammatory changes, mass lesions or
obstructive findings. The terminal ileum is normal. The appendix is
not identified for certain but I do not see any findings to suggest
appendicitis.

Vascular/Lymphatic: Scattered aortic and iliac artery calcifications
but no aneurysm or dissection. The branch vessels are patent. There
is a retroaortic left renal vein noted. No mesenteric or
retroperitoneal mass or adenopathy. Small scattered lymph nodes are
noted. Small external iliac lymph nodes all measuring less than 8
mm.

Reproductive: The uterus is surgically absent. The right ovary is
still present and appears normal. I do not see the left ovary for
certain.

Other: Moderate artifact related to the left hip prosthesis. There
is also some type of surgical device involving the lower anterior
abdominal wall.

Small scattered axillary lymph nodes but no overt adenopathy.

Musculoskeletal: The bony structures are intact. No acute bony
findings or worrisome bone lesions.
IMPRESSION: 1. No acute abdominal/pelvic findings, mass lesions or
lymphadenopathy.
2. 5 cm exophytic lesion projecting off the upper pole midpole
junction region of the left kidney. This is most likely a hyperdense
or proteinaceous cyst. This was present on a prior ultrasound from
3744 and measured a maximum of 3.8 cm. I would recommend a follow-up
renal ultrasound examination to confirm this.
3. Status post cholecystectomy. No biliary dilatation.

Aortic Atherosclerosis ([id number].X).

## 2022-09-04 ENCOUNTER — Other Ambulatory Visit: Payer: Self-pay | Admitting: Internal Medicine

## 2022-09-04 ENCOUNTER — Encounter: Payer: Self-pay | Admitting: Internal Medicine

## 2022-09-04 ENCOUNTER — Ambulatory Visit: Admission: RE | Admit: 2022-09-04 | Payer: Medicare PPO | Source: Ambulatory Visit

## 2022-09-04 DIAGNOSIS — M25472 Effusion, left ankle: Secondary | ICD-10-CM

## 2023-04-15 ENCOUNTER — Other Ambulatory Visit: Payer: Self-pay | Admitting: Internal Medicine

## 2023-04-15 ENCOUNTER — Ambulatory Visit
Admission: RE | Admit: 2023-04-15 | Discharge: 2023-04-15 | Disposition: A | Discharge status: Home / Self Care | Payer: Medicare PPO | Source: Ambulatory Visit | Attending: Internal Medicine | Admitting: Internal Medicine

## 2023-04-15 ENCOUNTER — Ambulatory Visit
Admission: RE | Admit: 2023-04-15 | Discharge: 2023-04-15 | Disposition: A | Discharge status: Home / Self Care | Payer: Medicare PPO | Attending: Internal Medicine | Admitting: Internal Medicine

## 2023-04-15 DIAGNOSIS — M25531 Pain in right wrist: Secondary | ICD-10-CM | POA: Insufficient documentation

## 2023-04-16 ENCOUNTER — Encounter: Payer: Self-pay | Admitting: Internal Medicine

## 2023-04-16 DIAGNOSIS — M25531 Pain in right wrist: Secondary | ICD-10-CM

## 2023-04-28 ENCOUNTER — Other Ambulatory Visit (HOSPITAL_COMMUNITY): Payer: Self-pay | Admitting: Internal Medicine

## 2023-04-28 DIAGNOSIS — Z789 Other specified health status: Secondary | ICD-10-CM

## 2023-04-28 DIAGNOSIS — I739 Peripheral vascular disease, unspecified: Secondary | ICD-10-CM

## 2023-05-05 ENCOUNTER — Ambulatory Visit
Admission: RE | Admit: 2023-05-05 | Discharge: 2023-05-05 | Disposition: A | Discharge status: Home / Self Care | Source: Ambulatory Visit | Attending: Internal Medicine | Admitting: Internal Medicine

## 2023-05-05 DIAGNOSIS — I739 Peripheral vascular disease, unspecified: Secondary | ICD-10-CM | POA: Insufficient documentation

## 2023-05-05 DIAGNOSIS — Z789 Other specified health status: Secondary | ICD-10-CM | POA: Insufficient documentation

## 2023-07-02 ENCOUNTER — Other Ambulatory Visit: Payer: Self-pay | Admitting: Internal Medicine

## 2023-07-02 DIAGNOSIS — M5412 Radiculopathy, cervical region: Secondary | ICD-10-CM

## 2023-07-02 DIAGNOSIS — Z780 Asymptomatic menopausal state: Secondary | ICD-10-CM

## 2023-07-14 ENCOUNTER — Ambulatory Visit
Admission: RE | Admit: 2023-07-14 | Discharge: 2023-07-14 | Disposition: A | Discharge status: Home / Self Care | Source: Ambulatory Visit | Attending: Internal Medicine | Admitting: Internal Medicine

## 2023-07-14 DIAGNOSIS — Z780 Asymptomatic menopausal state: Secondary | ICD-10-CM | POA: Diagnosis present

## 2023-07-15 ENCOUNTER — Ambulatory Visit
Admission: RE | Admit: 2023-07-15 | Discharge: 2023-07-15 | Disposition: A | Discharge status: Home / Self Care | Source: Ambulatory Visit | Attending: Internal Medicine | Admitting: Internal Medicine

## 2023-07-15 DIAGNOSIS — M5412 Radiculopathy, cervical region: Secondary | ICD-10-CM | POA: Insufficient documentation
